# Patient Record
Sex: Male | Born: 1995 | Hispanic: Yes | Marital: Single | Smoking: Never smoker
Health system: Southern US, Community
[De-identification: ages and names within clinical notes are randomized; demographics above are authoritative.]

## PROBLEM LIST (undated history)

## (undated) HISTORY — PX: NO PAST SURGERIES: SHX2092

---
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
## 2022-04-19 ENCOUNTER — Emergency Department (HOSPITAL_COMMUNITY)
Admission: EM | Admit: 2022-04-19 | Discharge: 2022-04-19 | Disposition: A | Discharge status: Home / Self Care | Payer: Self-pay | Attending: Emergency Medicine | Admitting: Emergency Medicine

## 2022-04-19 ENCOUNTER — Encounter (HOSPITAL_COMMUNITY): Payer: Self-pay

## 2022-04-19 ENCOUNTER — Other Ambulatory Visit: Payer: Self-pay

## 2022-04-19 ENCOUNTER — Emergency Department (HOSPITAL_COMMUNITY): Payer: Self-pay

## 2022-04-19 DIAGNOSIS — Z23 Encounter for immunization: Secondary | ICD-10-CM | POA: Insufficient documentation

## 2022-04-19 DIAGNOSIS — S91332A Puncture wound without foreign body, left foot, initial encounter: Secondary | ICD-10-CM | POA: Insufficient documentation

## 2022-04-19 DIAGNOSIS — W450XXA Nail entering through skin, initial encounter: Secondary | ICD-10-CM | POA: Insufficient documentation

## 2022-04-19 DIAGNOSIS — Y990 Civilian activity done for income or pay: Secondary | ICD-10-CM | POA: Insufficient documentation

## 2022-04-19 DIAGNOSIS — L089 Local infection of the skin and subcutaneous tissue, unspecified: Secondary | ICD-10-CM | POA: Insufficient documentation

## 2022-04-19 MED ORDER — CIPROFLOXACIN HCL 500 MG PO TABS
500.0000 mg | ORAL_TABLET | Freq: Once | ORAL | Status: AC
  Administered 2022-04-19: 500 mg via ORAL
  Filled 2022-04-19: qty 1

## 2022-04-19 MED ORDER — DOXYCYCLINE HYCLATE 100 MG PO TABS
100.0000 mg | ORAL_TABLET | Freq: Once | ORAL | Status: AC
  Administered 2022-04-19: 100 mg via ORAL
  Filled 2022-04-19: qty 1

## 2022-04-19 MED ORDER — IBUPROFEN 600 MG PO TABS: 600.0000 mg | ORAL_TABLET | Freq: Three times a day (TID) | ORAL | 0 refills | Status: AC | PRN

## 2022-04-19 MED ORDER — IBUPROFEN 400 MG PO TABS
600.0000 mg | ORAL_TABLET | Freq: Once | ORAL | Status: AC
  Administered 2022-04-19: 600 mg via ORAL
  Filled 2022-04-19: qty 1

## 2022-04-19 MED ORDER — DOXYCYCLINE HYCLATE 100 MG PO CAPS: 100.0000 mg | ORAL_CAPSULE | Freq: Two times a day (BID) | ORAL | 0 refills | Status: AC

## 2022-04-19 MED ORDER — CIPROFLOXACIN HCL 500 MG PO TABS: 500.0000 mg | ORAL_TABLET | Freq: Two times a day (BID) | ORAL | 0 refills | Status: AC

## 2022-04-19 MED ORDER — TETANUS-DIPHTH-ACELL PERTUSSIS 5-2.5-18.5 LF-MCG/0.5 IM SUSY
0.5000 mL | PREFILLED_SYRINGE | Freq: Once | INTRAMUSCULAR | Status: AC
  Administered 2022-04-19: 0.5 mL via INTRAMUSCULAR
  Filled 2022-04-19: qty 0.5

## 2022-04-19 NOTE — ED Provider Notes (Signed)
[facility] EMERGENCY DEPARTMENT Provider Note   CSN: [id number] Arrival date & time: 04/19/22  1021     History  Chief Complaint  Patient presents with   Foot Pain    Cody House is a 26 y.o. male.  HPI 26 year old male with no significant past medical history presents with left foot swelling.  6 days ago he stepped on a nail at work through his shoe.  Has had some pain and discomfort and pain with walking since.  He cleaned it out with alcohol the day it occurred.  However since yesterday or earlier today he noticed swelling and pain increasing to his foot, now dorsal.  No fevers.  He has taken Tylenol with some partial relief.  He is able to walk on it but it hurts.   Home Medications Prior to Admission medications   Medication Sig Start Date End Date Taking? Authorizing Provider  ciprofloxacin (CIPRO) 500 MG tablet Take 1 tablet (500 mg total) by mouth every 12 (twelve) hours. 04/19/22  Yes Pricilla Loveless, MD  doxycycline (VIBRAMYCIN) 100 MG capsule Take 1 capsule (100 mg total) by mouth 2 (two) times daily. 04/19/22  Yes Pricilla Loveless, MD  ibuprofen (ADVIL) 600 MG tablet Take 1 tablet (600 mg total) by mouth every 8 (eight) hours as needed. 04/19/22  Yes Pricilla Loveless, MD  oxyCODONE-acetaminophen (PERCOCET/ROXICET) 5-325 MG tablet Take 1 tablet by mouth every 4 (four) hours as needed for severe pain. 12/10/15   Eliseo Squires, PA-C      Allergies    Shellfish allergy    Review of Systems   Review of Systems  Constitutional:  Negative for fever.  Musculoskeletal:  Positive for arthralgias and joint swelling.  Neurological:  Negative for numbness.    Physical Exam Updated Vital Signs BP 126/75 (BP Location: Right Arm)   Pulse 74   Temp 98.1 F (36.7 C) (Oral)   Resp 18   SpO2 98%  Physical Exam Vitals and nursing note reviewed.  Constitutional:      Appearance: He is well-developed.  HENT:     Head: Normocephalic and atraumatic.   Cardiovascular:     Rate and Rhythm: Normal rate and regular rhythm.     Pulses:          Posterior tibial pulses are 2+ on the right side.     Heart sounds: Normal heart sounds.  Pulmonary:     Effort: Pulmonary effort is normal.  Musculoskeletal:       Feet:  Feet:     Comments: Grossly normal sensation in left foot Normal ROM of ankle Skin:    General: Skin is warm and dry.     Findings: No erythema.  Neurological:     Mental Status: He is alert.     ED Results / Procedures / Treatments   Labs (all labs ordered are listed, but only abnormal results are displayed) Labs Reviewed - No data to display  EKG None  Radiology DG Foot Complete Left  Result Date: 04/19/2022 CLINICAL DATA:  Left foot pain and swelling after stepping on nail weeks ago. EXAM: LEFT FOOT - COMPLETE 3+ VIEW COMPARISON:  None Available. FINDINGS: There is no evidence of fracture or dislocation. There is no evidence of arthropathy or other focal bone abnormality. Soft tissues are unremarkable. IMPRESSION: Negative. Electronically Signed   By: Lupita Raider M.D.   On: 04/19/2022 12:31    Procedures Procedures    Medications Ordered in ED Medications  ibuprofen (ADVIL) tablet 600 mg (has no administration in time range)  ciprofloxacin (CIPRO) tablet 500 mg (has no administration in time range)  doxycycline (VIBRA-TABS) tablet 100 mg (has no administration in time range)  Tdap (BOOSTRIX) injection 0.5 mL (0.5 mLs Intramuscular Given 04/19/22 1326)    ED Course/ Medical Decision Making/ A&P                           Medical Decision Making Risk Prescription drug management.   X-ray images viewed/interpreted by myself.  No fracture or foreign body.  There is a small dot that is consistent with the marker used but no foreign body noted by me or radiology.  Likely has a wound infection.  Would like to cover for Pseudomonas since the nail went through his shoe and the plantar aspect of his foot.   However certainly need to cover for staph as well.  Discussed with pharmacy, patient has no chronic medical problems and so Cipro and doxycycline would be a good combination.  I do not think he has a likely abscess or deep space infection such as osteomyelitis, necrotizing infection, etc.  Will give NSAIDs for pain.  Will give a postop shoe for comfort.  Otherwise, he appears stable for outpatient management.  Will refer to podiatry in case not improving.  Given return precautions.       Final Clinical Impression(s) / ED Diagnoses Final diagnoses:  Puncture wound of plantar aspect of left foot with infection, initial encounter    Rx / DC Orders ED Discharge Orders          Ordered    ibuprofen (ADVIL) 600 MG tablet  Every 8 hours PRN        04/19/22 1337    ciprofloxacin (CIPRO) 500 MG tablet  Every 12 hours        04/19/22 1337    doxycycline (VIBRAMYCIN) 100 MG capsule  2 times daily        04/19/22 1337              Pricilla Loveless, MD 04/19/22 1407

## 2022-04-19 NOTE — Discharge Instructions (Signed)
You are being given 2 different antibiotics to help treat your foot infection.  If you develop fever, new or worsening redness, new or worsening swelling, or any other new/concerning symptoms then return to the ER for evaluation.  You are being prescribed ibuprofen to help with pain.  You may still take Tylenol in addition to this.  Be sure to elevate your leg when you are at rest.  If your symptoms or not improving, follow-up with the foot specialist listed next week.  Le estn dando 2 antibiticos diferentes para ayudar a tratar la infeccin del pie. Si presenta fiebre, enrojecimiento nuevo o que [place], hinchazn nueva o que [place], o cualquier otro sntoma nuevo o preocupante, regrese a la sala de emergencias para una evaluacin. Le recetan ibuprofeno para Engineer, materials. An puedes tomar Tylenol adems de esto. Asegrate de elevar la pierna cuando ests en reposo. Si sus sntomas no mejoran, haga un seguimiento con el especialista en pies que figura la prxima semana.

## 2022-04-19 NOTE — ED Provider Triage Note (Signed)
Emergency Medicine Provider Triage Evaluation Note  Cody House , a 26 y.o. male  was evaluated in triage.  Pt complains of puncture wound to left foot that occurred last Friday.  Pain, swelling worsening.  Denies fever or drainage from the site.  Unsure of his last tetanus shot.  Review of Systems  Positive: As above Negative: As above  Physical Exam  BP (!) 160/91   Pulse 81   Temp 98.2 F (36.8 C) (Oral)   Resp 16   SpO2 98%  Gen:   Awake, no distress   Resp:  Normal effort  MSK:   Moves extremities without difficulty  Other:    Medical Decision Making  Medically screening exam initiated at 11:40 AM.  Appropriate orders placed.  Cody House was informed that the remainder of the evaluation will be completed by another provider, this initial triage assessment does not replace that evaluation, and the importance of remaining in the ED until their evaluation is complete.     Marita [place], PA-C 04/19/22 1141

## 2022-04-19 NOTE — ED Triage Notes (Addendum)
Pt has left foot pain after stepping on a nail last Friday while working. Pt states he has never had a tetanus shot.
